# Patient Record
Sex: Male | Born: 2000
Health system: Southern US, Community
[De-identification: ages and names within clinical notes are randomized; demographics above are authoritative.]

---
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
## 2022-02-04 ENCOUNTER — Other Ambulatory Visit (HOSPITAL_BASED_OUTPATIENT_CLINIC_OR_DEPARTMENT_OTHER): Payer: Self-pay

## 2023-08-03 ENCOUNTER — Other Ambulatory Visit (HOSPITAL_BASED_OUTPATIENT_CLINIC_OR_DEPARTMENT_OTHER): Payer: Self-pay

## 2023-08-05 ENCOUNTER — Emergency Department (HOSPITAL_BASED_OUTPATIENT_CLINIC_OR_DEPARTMENT_OTHER): Admitting: Radiology

## 2023-08-05 ENCOUNTER — Emergency Department (HOSPITAL_BASED_OUTPATIENT_CLINIC_OR_DEPARTMENT_OTHER)

## 2023-08-05 ENCOUNTER — Encounter (HOSPITAL_BASED_OUTPATIENT_CLINIC_OR_DEPARTMENT_OTHER): Payer: Self-pay

## 2023-08-05 ENCOUNTER — Other Ambulatory Visit: Payer: Self-pay

## 2023-08-05 ENCOUNTER — Emergency Department (HOSPITAL_BASED_OUTPATIENT_CLINIC_OR_DEPARTMENT_OTHER)
Admission: EM | Admit: 2023-08-05 | Discharge: 2023-08-06 | Disposition: A | Discharge status: Home / Self Care | Attending: Emergency Medicine | Admitting: Emergency Medicine

## 2023-08-05 DIAGNOSIS — Y92410 Unspecified street and highway as the place of occurrence of the external cause: Secondary | ICD-10-CM | POA: Diagnosis not present

## 2023-08-05 DIAGNOSIS — M25531 Pain in right wrist: Secondary | ICD-10-CM | POA: Insufficient documentation

## 2023-08-05 DIAGNOSIS — S6991XA Unspecified injury of right wrist, hand and finger(s), initial encounter: Secondary | ICD-10-CM | POA: Diagnosis present

## 2023-08-05 DIAGNOSIS — S60221A Contusion of right hand, initial encounter: Secondary | ICD-10-CM | POA: Diagnosis not present

## 2023-08-05 MED ORDER — CYCLOBENZAPRINE HCL 5 MG PO TABS: 5.0000 mg | ORAL_TABLET | Freq: Two times a day (BID) | ORAL | 0 refills | Status: AC | PRN

## 2023-08-05 MED ORDER — IBUPROFEN 800 MG PO TABS
800.0000 mg | ORAL_TABLET | Freq: Once | ORAL | Status: AC
  Administered 2023-08-05: 800 mg via ORAL
  Filled 2023-08-05: qty 1

## 2023-08-05 MED ORDER — IBUPROFEN 600 MG PO TABS: 600.0000 mg | ORAL_TABLET | Freq: Four times a day (QID) | ORAL | 0 refills | Status: AC | PRN
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
## 2023-08-06 NOTE — ED Notes (Signed)
 Discharge instructions reviewed.   Newly prescribed medications discussed. Pharmacy verified.   Opportunity for questions and concerns provided.   Alert, oriented and ambulatory. Displays no signs of distress.

## 2023-08-11 ENCOUNTER — Other Ambulatory Visit (HOSPITAL_BASED_OUTPATIENT_CLINIC_OR_DEPARTMENT_OTHER): Payer: Self-pay

## 2023-12-14 ENCOUNTER — Other Ambulatory Visit (HOSPITAL_BASED_OUTPATIENT_CLINIC_OR_DEPARTMENT_OTHER): Payer: Self-pay
